# Patient Record
Sex: Female | Born: 1940 | Race: White | Hispanic: No | Marital: Married | Smoking: Former smoker
Health system: Southern US, Community
[De-identification: ages and names within clinical notes are randomized; demographics above are authoritative.]

## PROBLEM LIST (undated history)

## (undated) DIAGNOSIS — I2699 Other pulmonary embolism without acute cor pulmonale: Secondary | ICD-10-CM

## (undated) HISTORY — PX: APPENDECTOMY: SHX54

## (undated) HISTORY — PX: TONSILLECTOMY: SUR1361

## (undated) HISTORY — PX: JOINT REPLACEMENT: SHX530

## (undated) HISTORY — PX: CHOLECYSTECTOMY: SHX55

## (undated) HISTORY — DX: Other pulmonary embolism without acute cor pulmonale: I26.99

---

## 1972-06-17 DIAGNOSIS — I2699 Other pulmonary embolism without acute cor pulmonale: Secondary | ICD-10-CM

## 1972-06-17 HISTORY — DX: Other pulmonary embolism without acute cor pulmonale: I26.99

## 2008-02-01 ENCOUNTER — Ambulatory Visit: Payer: Self-pay | Admitting: General Surgery

## 2009-06-27 ENCOUNTER — Ambulatory Visit: Payer: Self-pay | Admitting: Internal Medicine

## 2009-07-31 ENCOUNTER — Ambulatory Visit: Payer: Self-pay | Admitting: Internal Medicine

## 2009-11-10 ENCOUNTER — Ambulatory Visit: Payer: Self-pay | Admitting: Gastroenterology

## 2010-07-04 ENCOUNTER — Ambulatory Visit: Payer: Self-pay | Admitting: Internal Medicine

## 2011-02-28 ENCOUNTER — Telehealth: Payer: Self-pay | Admitting: Internal Medicine

## 2011-02-28 NOTE — Telephone Encounter (Signed)
PT CALLED TO SCHEDULE HER CPE I SCHEDULE IT FOR  04/12/11 BUT SHE WANTED TO SEE WHEN HER LAST CPX WAS.  SHE SAID SHE SIGNED MEDICAL RELEASE FORM

## 2011-02-28 NOTE — Telephone Encounter (Signed)
Left message for patient to return my call.

## 2011-04-12 ENCOUNTER — Ambulatory Visit (INDEPENDENT_AMBULATORY_CARE_PROVIDER_SITE_OTHER): Payer: Medicare Other | Admitting: Internal Medicine

## 2011-04-12 ENCOUNTER — Encounter: Payer: Self-pay | Admitting: Internal Medicine

## 2011-04-12 DIAGNOSIS — Z0000 Encounter for general adult medical examination without abnormal findings: Secondary | ICD-10-CM

## 2011-04-12 NOTE — Patient Instructions (Signed)
You are doing well. Continue with healthy diet and exercise.  Follow up in 6 months or earlier as needed.

## 2011-04-12 NOTE — Progress Notes (Signed)
Subjective:    Jade Campbell is a 70 y.o. female who presents for Medicare Annual/Subsequent preventive examination. She denies any complaints today. She reports that she has been feeling well. She has been very active exercising regularly by working out at [employer]. She denies any recent fever, chills, fatigue, dysuria, constipation, diarrhea, or other complaints.  Preventive Screening-Counseling & Management  Tobacco History  Smoking status  . Former Smoker  . Quit date: 06/17/1976  Smokeless tobacco  . Never Used      Current Problems (verified) There is no problem list on file for this patient.   Medications Prior to Visit No current outpatient prescriptions on file prior to visit.    Current Medications (verified) No current outpatient prescriptions on file.     Allergies (verified) Sulfa drugs cross reactors   PAST HISTORY 1. Pulmonary Embolus  Family History No family history on file.  Social History History  Substance Use Topics  . Smoking status: Former Smoker    Quit date: 06/17/1976  . Smokeless tobacco: Never Used  . Alcohol Use: Yes     occassionally     Are there smokers in your home (other than you)? Yes, daughter smokes outside  Risk Factors Current exercise habits: works out at [employer] 2-3 times per week for 1.5 hr  Dietary issues discussed: Healthy, low in saturated fat, high fiber   Cardiac risk factors: advanced age (older than 69 for men, 75 for women).  Depression Screen (Note: if answer to either of the following is "Yes", a more complete depression screening is indicated)   Over the past two weeks, have you felt down, depressed or hopeless? No  Over the past two weeks, have you felt little interest or pleasure in doing things? No  Have you lost interest or pleasure in daily life? No  Do you often feel hopeless? No  Do you cry easily over simple problems? No  Activities of Daily Living In your present state of health, do you have  any difficulty performing the following activities?:  Driving? No Managing money?  No Feeding yourself? No Getting from bed to chair? No Climbing a flight of stairs? No Preparing food and eating?: No Bathing or showering? No Getting dressed: No Getting to the toilet? No Using the toilet:No Moving around from place to place: No In the past year have you fallen or had a near fall?:No   Are you sexually active?  No  Do you have more than one partner?  No  Hearing Difficulties: No Do you often ask people to speak up or repeat themselves? No Do you experience ringing or noises in your ears? No Do you have difficulty understanding soft or whispered voices? No   Do you feel that you have a problem with memory? No  Do you often misplace items? No  Do you feel safe at home?  Yes  Cognitive Testing  Alert? Yes  Normal Appearance?Yes  Oriented to person? Yes  Place? Yes   Time? Yes  Recall of three objects?  Yes  Can perform simple calculations? Yes  Displays appropriate judgment?Yes  Can read the correct time from a watch face?Yes   Advanced Directives have been discussed with the patient? Yes  List the Names of Other Physician/Practitioners you currently use: 1.  [facility] - Dr. Fransico Michael    There is no immunization history on file for this patient.  Screening Tests Health Maintenance  Topic Date Due  . Tetanus/tdap  09/15/1960  . Mammogram  09/16/1991  . Colonoscopy  09/16/1991  . Zostavax  09/15/2001  . Pneumococcal Polysaccharide Vaccine Age 9 And Over  09/16/2006  . Influenza Vaccine  03/18/2011    All answers were reviewed with the patient and necessary referrals were made:  Shelia Media, MD   04/12/2011   History reviewed: allergies, current medications, past family history, past medical history, past social history, past surgical history and problem list  Review of Systems A comprehensive review of systems was negative.    Objective:      Body mass index is  29.02 kg/(m^2). BP 127/84  Pulse 54  Temp 98.4 F (36.9 C)  Resp 16  Ht 5\' 10"  (1.778 m)  Wt 202 lb 4 oz (91.74 kg)  BMI 29.02 kg/m2  SpO2 98%  BP 127/84  Pulse 54  Temp 98.4 F (36.9 C)  Resp 16  Ht 5\' 10"  (1.778 m)  Wt 202 lb 4 oz (91.74 kg)  BMI 29.02 kg/m2  SpO2 98%  General Appearance:    Alert, cooperative, no distress, appears stated age  Head:    Normocephalic, without obvious abnormality, atraumatic  Eyes:    PERRL, conjunctiva/corneas clear, EOM's intact, fundi    benign, both eyes  Ears:    Normal TM's and external ear canals, both ears  Nose:   Nares normal, septum midline, mucosa normal, no drainage    or sinus tenderness  Throat:   Lips, mucosa, and tongue normal; teeth and gums normal  Neck:   Supple, symmetrical, trachea midline, no adenopathy;    thyroid:  no enlargement/tenderness/nodules; no carotid   bruit or JVD  Back:     Symmetric, no curvature, ROM normal, no CVA tenderness  Lungs:     Clear to auscultation bilaterally, respirations unlabored  Chest Wall:    No tenderness or deformity   Heart:    Regular rate and rhythm, S1 and S2 normal, no murmur, rub   or gallop  Breast Exam:    No tenderness, masses, or nipple abnormality  Abdomen:     Soft, non-tender, bowel sounds active all four quadrants,    no masses, no organomegaly  Extremities:   Extremities normal, atraumatic, no cyanosis or edema  Pulses:   2+ and symmetric all extremities  Skin:   Skin color, texture, turgor normal, no rashes or lesions  Lymph nodes:   Cervical, supraclavicular, and axillary nodes normal  Neurologic:   CNII-XII intact, normal strength, sensation and reflexes    throughout       Assessment and Plan:     1. General well exam - Pt is up to date on health maintenance including PAP smear and colonoscopy. Mammogram is scheduled. Breast exam was normal today.  Labs including CBC, CMP, lipids were ordered today. Flu shot declined.  Will check old records to see if  patient has received Zostavax and is up to date on bone density testing. Encouraged continued healthy diet and regular exercise.      Diet review for nutrition referral? Not Indicated  Patient Instructions (the written plan) was given to the patient.  Medicare Attestation I have personally reviewed: The patient's medical and social history Their use of alcohol, tobacco or illicit drugs Their current medications and supplements The patient's functional ability including ADLs,fall risks, home safety risks, cognitive, and hearing and visual impairment Diet and physical activities Evidence for depression or mood disorders  The patient's weight, height, BMI, and visual acuity have been recorded in the chart.  I have  made referrals, counseling, and provided education to the patient based on review of the above and I have provided the patient with a written personalized care plan for preventive services.     Shelia Media, MD   04/12/2011

## 2011-04-16 ENCOUNTER — Telehealth: Payer: Self-pay | Admitting: Internal Medicine

## 2011-04-16 NOTE — Telephone Encounter (Signed)
Labs from [facility] including CBC, CMP, and lipids were all normal.

## 2011-04-16 NOTE — Telephone Encounter (Signed)
Patient notified

## 2011-04-23 ENCOUNTER — Telehealth: Payer: Self-pay | Admitting: Internal Medicine

## 2011-04-23 NOTE — Telephone Encounter (Signed)
If her insurance will cover shingles vaccine, fine to call in Rx.

## 2011-04-23 NOTE — Telephone Encounter (Signed)
Jade Campbell [phone] Pt came in today checking to see if we received her records from [facility].  i called spoke to lisa she is sending the entire chart including previous md. Her pneumia shot was 05/23/09  tdap was 12/11/09 no shingles shot was on record at [facility] per lisa.  Pt would like to get shot. She will check with insurance company first

## 2011-04-23 NOTE — Telephone Encounter (Signed)
Patient will call office back if she needs RX for shingles after calling insurance.

## 2011-04-24 ENCOUNTER — Telehealth: Payer: Self-pay | Admitting: Internal Medicine

## 2011-04-24 MED ORDER — ZOSTER VACCINE LIVE 19400 UNT/0.65ML ~~LOC~~ SOLR: 0.6500 mL | Freq: Once | SUBCUTANEOUS | Status: AC

## 2011-04-24 NOTE — Telephone Encounter (Signed)
Pending signature

## 2011-04-24 NOTE — Telephone Encounter (Signed)
Pt would like to get a rx for shingles shot.  Please call her when ready

## 2011-04-25 NOTE — Telephone Encounter (Signed)
Left vm for pt that RX was ready for pick up

## 2011-05-06 ENCOUNTER — Encounter: Payer: Self-pay | Admitting: Internal Medicine

## 2011-05-23 ENCOUNTER — Encounter: Payer: Self-pay | Admitting: Internal Medicine

## 2011-06-07 ENCOUNTER — Telehealth: Payer: Self-pay | Admitting: Internal Medicine

## 2011-06-07 MED ORDER — AMOXICILLIN 500 MG PO CAPS: 2000.0000 mg | ORAL_CAPSULE | Freq: Once | ORAL | Status: AC

## 2011-06-07 NOTE — Telephone Encounter (Signed)
The typical dose is Amoxicillin 2GM 1 hr prior to dental procedure.  We can call this in for her.

## 2011-06-07 NOTE — Telephone Encounter (Signed)
Patient informed, RX sent in  

## 2011-06-07 NOTE — Telephone Encounter (Signed)
Patient is needing an prescription for amoxicillin 500 mg before she has dental work done, so it will not effect her knees she is going to [place] to have dental work done . She is leaving on next Wednesday.. Patient is needing a response today.

## 2011-06-07 NOTE — Telephone Encounter (Signed)
Please advise 

## 2011-06-13 ENCOUNTER — Encounter: Payer: Self-pay | Admitting: Internal Medicine

## 2011-06-24 ENCOUNTER — Ambulatory Visit (INDEPENDENT_AMBULATORY_CARE_PROVIDER_SITE_OTHER): Payer: Medicare Other | Admitting: *Deleted

## 2011-06-24 DIAGNOSIS — Z23 Encounter for immunization: Secondary | ICD-10-CM

## 2011-06-26 ENCOUNTER — Telehealth: Payer: Self-pay | Admitting: Internal Medicine

## 2011-06-26 DIAGNOSIS — Z1239 Encounter for other screening for malignant neoplasm of breast: Secondary | ICD-10-CM

## 2011-06-26 NOTE — Telephone Encounter (Signed)
Pt came in to get an appointment with [facility] for her mammorgram.   i told her she could make her own appointment with them.  Do we need to send them an order

## 2011-06-27 LAB — HM MAMMOGRAPHY

## 2011-06-27 NOTE — Telephone Encounter (Signed)
Can we please get an order for a mammogram patient is going to make her own appointment. Thanks

## 2011-06-27 NOTE — Telephone Encounter (Signed)
Order added and signed

## 2011-06-27 NOTE — Telephone Encounter (Signed)
Printed order, Dr. Dan Humphreys signed and I faxed to [facility].

## 2011-07-12 ENCOUNTER — Ambulatory Visit: Payer: Self-pay | Admitting: Internal Medicine

## 2011-07-22 ENCOUNTER — Encounter: Payer: Self-pay | Admitting: Internal Medicine

## 2011-07-25 ENCOUNTER — Encounter: Payer: Self-pay | Admitting: Internal Medicine

## 2011-10-03 ENCOUNTER — Encounter: Payer: Self-pay | Admitting: Internal Medicine

## 2011-10-31 ENCOUNTER — Encounter: Payer: Self-pay | Admitting: Internal Medicine

## 2011-10-31 ENCOUNTER — Ambulatory Visit (INDEPENDENT_AMBULATORY_CARE_PROVIDER_SITE_OTHER): Payer: Medicare Other | Admitting: Internal Medicine

## 2011-10-31 VITALS — BP 118/63 | HR 54 | Temp 97.5°F | Resp 16 | Ht 69.75 in | Wt 189.5 lb

## 2011-10-31 DIAGNOSIS — E785 Hyperlipidemia, unspecified: Secondary | ICD-10-CM | POA: Insufficient documentation

## 2011-10-31 LAB — LIPID PANEL
Cholesterol: 178 mg/dL (ref 0–200)
HDL: 53 mg/dL (ref >39.00)
Triglycerides: 70 mg/dL (ref 0.0–149.0)
VLDL: 14 mg/dL (ref 0.0–40.0)

## 2011-10-31 LAB — COMPREHENSIVE METABOLIC PANEL
ALT: 12 U/L (ref 0–35)
BUN: 13 mg/dL (ref 6–23)
CO2: 28 mEq/L (ref 19–32)
Calcium: 8.9 mg/dL (ref 8.4–10.5)
Chloride: 103 mEq/L (ref 96–112)
Creatinine, Ser: 0.7 mg/dL (ref 0.4–1.2)
GFR: 92.45 mL/min (ref >60.00)
Glucose, Bld: 72 mg/dL (ref 70–99)
Total Bilirubin: 0.4 mg/dL (ref 0.3–1.2)

## 2011-10-31 NOTE — Progress Notes (Signed)
  Subjective:    Patient ID: Jade Campbell, female    DOB: [birth year], 71 y.o.   MRN: [id number]  HPI 71 year old female presents for followup. She reports she is generally feeling well. She brings records today so that we can update her health maintenance in her medical record. She is currently up to date on everything except for DEXA scanning. She was noted to have slightly elevated cholesterol in the past. She has never been on cholesterol medication. She follows a healthy diet and gets regular physical activity.  Outpatient Encounter Prescriptions as of 10/31/2011  Medication Sig Dispense Refill  . Calcium Carbonate-Vitamin D (CALCIUM-VITAMIN D3 PO) Take by mouth daily.      . Multiple Vitamin (MULTIVITAMIN) tablet Take 1 tablet by mouth daily.        Review of Systems  Constitutional: Negative for fever, chills, appetite change, fatigue and unexpected weight change.  HENT: Negative for ear pain, congestion, sore throat, trouble swallowing, neck pain, voice change and sinus pressure.   Eyes: Negative for visual disturbance.  Respiratory: Negative for cough, shortness of breath, wheezing and stridor.   Cardiovascular: Negative for chest pain, palpitations and leg swelling.  Gastrointestinal: Negative for nausea, vomiting, abdominal pain, diarrhea, constipation, blood in stool, abdominal distention and anal bleeding.  Genitourinary: Negative for dysuria and flank pain.  Musculoskeletal: Negative for myalgias, arthralgias and gait problem.  Skin: Negative for color change and rash.  Neurological: Negative for dizziness and headaches.  Hematological: Negative for adenopathy. Does not bruise/bleed easily.  Psychiatric/Behavioral: Negative for suicidal ideas, sleep disturbance and dysphoric mood. The patient is not nervous/anxious.    BP 118/63  Pulse 54  Temp(Src) 97.5 F (36.4 C) (Oral)  Resp 16  Ht 5' 9.75" (1.772 m)  Wt 189 lb 8 oz (85.957 kg)  BMI 27.39 kg/m2  SpO2 100%       Objective:   Physical Exam  Constitutional: She is oriented to person, place, and time. She appears well-developed and well-nourished. No distress.  HENT:  Head: Normocephalic and atraumatic.  Right Ear: External ear normal.  Left Ear: External ear normal.  Nose: Nose normal.  Mouth/Throat: Oropharynx is clear and moist. No oropharyngeal exudate.  Eyes: Conjunctivae are normal. Pupils are equal, round, and reactive to light. Right eye exhibits no discharge. Left eye exhibits no discharge. No scleral icterus.  Neck: Normal range of motion. Neck supple. No tracheal deviation present. No thyromegaly present.  Cardiovascular: Normal rate, regular rhythm, normal heart sounds and intact distal pulses.  Exam reveals no gallop and no friction rub.   No murmur heard. Pulmonary/Chest: Effort normal and breath sounds normal. No respiratory distress. She has no wheezes. She has no rales. She exhibits no tenderness.  Abdominal: Soft. Bowel sounds are normal. She exhibits no distension and no mass. There is no tenderness. There is no guarding.  Musculoskeletal: Normal range of motion. She exhibits no edema and no tenderness.  Lymphadenopathy:    She has no cervical adenopathy.  Neurological: She is alert and oriented to person, place, and time. No cranial nerve deficit. She exhibits normal muscle tone. Coordination normal.  Skin: Skin is warm and dry. No rash noted. She is not diaphoretic. No erythema. No pallor.  Psychiatric: She has a normal mood and affect. Her behavior is normal. Judgment and thought content normal.          Assessment & Plan:

## 2011-10-31 NOTE — Assessment & Plan Note (Signed)
Lipids slightly elevated in past. Will recheck lipids and LFTs today. Goal LDL <100.

## 2011-11-21 ENCOUNTER — Encounter: Payer: Self-pay | Admitting: Internal Medicine

## 2012-05-06 ENCOUNTER — Ambulatory Visit (INDEPENDENT_AMBULATORY_CARE_PROVIDER_SITE_OTHER): Payer: Medicare Other | Admitting: Internal Medicine

## 2012-05-06 ENCOUNTER — Encounter: Payer: Self-pay | Admitting: Internal Medicine

## 2012-05-06 VITALS — BP 142/78 | HR 68 | Temp 97.7°F | Ht 69.0 in | Wt 191.0 lb

## 2012-05-06 DIAGNOSIS — Z0000 Encounter for general adult medical examination without abnormal findings: Secondary | ICD-10-CM

## 2012-05-06 DIAGNOSIS — E559 Vitamin D deficiency, unspecified: Secondary | ICD-10-CM | POA: Insufficient documentation

## 2012-05-06 DIAGNOSIS — D649 Anemia, unspecified: Secondary | ICD-10-CM

## 2012-05-06 DIAGNOSIS — Z23 Encounter for immunization: Secondary | ICD-10-CM

## 2012-05-06 DIAGNOSIS — E785 Hyperlipidemia, unspecified: Secondary | ICD-10-CM

## 2012-05-06 LAB — LIPID PANEL
HDL: 52.2 mg/dL (ref >39.00)
Total CHOL/HDL Ratio: 4
Triglycerides: 57 mg/dL (ref 0.0–149.0)
VLDL: 11.4 mg/dL (ref 0.0–40.0)

## 2012-05-06 LAB — CBC
Hemoglobin: 13.1 g/dL (ref 12.0–15.0)
MCHC: 32.4 g/dL (ref 30.0–36.0)
MCV: 84.4 fl (ref 78.0–100.0)
Platelets: 253 10*3/uL (ref 150.0–400.0)
RDW: 13.8 % (ref 11.5–14.6)

## 2012-05-06 LAB — COMPREHENSIVE METABOLIC PANEL
ALT: 16 U/L (ref 0–35)
AST: 20 U/L (ref 0–37)
BUN: 12 mg/dL (ref 6–23)
Calcium: 9.1 mg/dL (ref 8.4–10.5)
Chloride: 102 mEq/L (ref 96–112)
Creatinine, Ser: 0.6 mg/dL (ref 0.4–1.2)
GFR: 100.96 mL/min (ref >60.00)
Total Bilirubin: 0.5 mg/dL (ref 0.3–1.2)

## 2012-05-06 NOTE — Progress Notes (Signed)
Subjective:    Patient ID: Jade Campbell, female    DOB: July 23, 1940, 71 y.o.   MRN: [id number]  HPI The patient is here for annual Medicare wellness examination and management of other chronic and acute problems.   The risk factors are reflected in the social history.  The roster of all physicians providing medical care to patient - is listed in the Snapshot section of the chart.  Activities of daily living:  The patient is 100% independent in all ADLs: dressing, toileting, feeding as well as independent mobility  Home safety : The patient has smoke detectors in the home. They wear seatbelts.  There are no firearms at home. There is no violence in the home.   There is no risks for hepatitis, STDs or HIV. There is no history of blood transfusion. They have no travel history to infectious disease endemic areas of the world.  The patient has seen their dentist in the last six month. ([place] - Dr. Welton Flakes) They have seen their eye doctor in the last year. [facility]) No current issues with hearing. They have deferred audiologic testing in the last year.   They do not  have excessive sun exposure. Discussed the need for sun protection: hats, long sleeves and use of sunscreen if there is significant sun exposure. Dermatologist - none)  Diet: the importance of a healthy diet is discussed. They do have a healthy diet.  The benefits of regular aerobic exercise were discussed. She walks or bikes several times per week.  Depression screen: there are no signs or vegative symptoms of depression- irritability, change in appetite, anhedonia, sadness/tearfullness.  Cognitive assessment: the patient manages all their financial and personal affairs and is actively engaged. They could relate day,date,year and events.  HCPOA - spouse, Living Will in place. Son is executor of will.  The following portions of the patient's history were reviewed and updated as appropriate: allergies, current medications,  past family history, past medical history,  past surgical history, past social history  and problem list.  Visual acuity was not assessed per patient preference since she has regular follow up with her ophthalmologist. Hearing and body mass index were assessed and reviewed.   During the course of the visit the patient was educated and counseled about appropriate screening and preventive services including : fall prevention , diabetes screening, nutrition counseling, colorectal cancer screening, and recommended immunizations.     Review of Systems  Constitutional: Negative for fever, chills, appetite change, fatigue and unexpected weight change.  HENT: Negative for ear pain, congestion, sore throat, trouble swallowing, neck pain, voice change and sinus pressure.   Eyes: Negative for visual disturbance.  Respiratory: Negative for cough, shortness of breath, wheezing and stridor.   Cardiovascular: Negative for chest pain, palpitations and leg swelling.  Gastrointestinal: Negative for nausea, vomiting, abdominal pain, diarrhea, constipation, blood in stool, abdominal distention and anal bleeding.  Genitourinary: Negative for dysuria and flank pain.  Musculoskeletal: Negative for myalgias, arthralgias and gait problem.  Skin: Negative for color change and rash.  Neurological: Negative for dizziness and headaches.  Hematological: Negative for adenopathy. Does not bruise/bleed easily.  Psychiatric/Behavioral: Negative for suicidal ideas, sleep disturbance and dysphoric mood. The patient is not nervous/anxious.        Objective:   Physical Exam  Constitutional: She is oriented to person, place, and time. She appears well-developed and well-nourished. No distress.  HENT:  Head: Normocephalic and atraumatic.  Right Ear: External ear normal.  Left Ear: External ear  normal.  Nose: Nose normal.  Mouth/Throat: Oropharynx is clear and moist. No oropharyngeal exudate.  Eyes: Conjunctivae normal are  normal. Pupils are equal, round, and reactive to light. Right eye exhibits no discharge. Left eye exhibits no discharge. No scleral icterus.  Neck: Normal range of motion. Neck supple. No tracheal deviation present. No thyromegaly present.  Cardiovascular: Normal rate, regular rhythm, normal heart sounds and intact distal pulses.  Exam reveals no gallop and no friction rub.   No murmur heard. Pulmonary/Chest: Effort normal and breath sounds normal. No respiratory distress. She has no wheezes. She has no rales. She exhibits no tenderness. Right breast exhibits no inverted nipple, no mass, no nipple discharge, no skin change and no tenderness. Left breast exhibits no inverted nipple, no mass, no nipple discharge, no skin change and no tenderness. Breasts are symmetrical.  Abdominal: Soft. Bowel sounds are normal. She exhibits no distension and no mass. There is no tenderness. There is no rebound and no guarding.  Musculoskeletal: Normal range of motion. She exhibits no edema and no tenderness.  Lymphadenopathy:    She has no cervical adenopathy.  Neurological: She is alert and oriented to person, place, and time. No cranial nerve deficit. She exhibits normal muscle tone. Coordination normal.  Skin: Skin is warm and dry. No rash noted. She is not diaphoretic. No erythema. No pallor.  Psychiatric: She has a normal mood and affect. Her behavior is normal. Judgment and thought content normal.          Assessment & Plan:

## 2012-05-06 NOTE — Assessment & Plan Note (Signed)
General medical exam including breast exam normal today. Pap deferred because of age and all previous paps normal. Health maintenance is up to date. Vaccine given today. Appropriate screening performed. Encouraged continued efforts at healthy diet and regular exercise. Followup in one year or sooner if needed.

## 2012-05-07 LAB — VITAMIN D 25 HYDROXY (VIT D DEFICIENCY, FRACTURES): Vit D, 25-Hydroxy: 41 ng/mL (ref 30–89)

## 2012-08-11 ENCOUNTER — Ambulatory Visit: Payer: Self-pay | Admitting: Internal Medicine

## 2012-08-28 ENCOUNTER — Encounter: Payer: Self-pay | Admitting: Internal Medicine

## 2012-09-01 ENCOUNTER — Encounter: Payer: Self-pay | Admitting: Internal Medicine

## 2012-12-22 ENCOUNTER — Encounter: Payer: Self-pay | Admitting: Internal Medicine

## 2012-12-22 ENCOUNTER — Ambulatory Visit (INDEPENDENT_AMBULATORY_CARE_PROVIDER_SITE_OTHER): Payer: Medicare Other | Admitting: Internal Medicine

## 2012-12-22 VITALS — BP 118/70 | HR 56 | Temp 98.2°F | Wt 197.0 lb

## 2012-12-22 DIAGNOSIS — E663 Overweight: Secondary | ICD-10-CM | POA: Insufficient documentation

## 2012-12-22 DIAGNOSIS — Z6825 Body mass index (BMI) 25.0-25.9, adult: Secondary | ICD-10-CM

## 2012-12-22 DIAGNOSIS — Z7189 Other specified counseling: Secondary | ICD-10-CM

## 2012-12-22 DIAGNOSIS — Z7184 Encounter for health counseling related to travel: Secondary | ICD-10-CM | POA: Insufficient documentation

## 2012-12-22 DIAGNOSIS — H6120 Impacted cerumen, unspecified ear: Secondary | ICD-10-CM | POA: Insufficient documentation

## 2012-12-22 DIAGNOSIS — H6123 Impacted cerumen, bilateral: Secondary | ICD-10-CM

## 2012-12-22 MED ORDER — CIPROFLOXACIN HCL 500 MG PO TABS: 500.0000 mg | ORAL_TABLET | Freq: Two times a day (BID) | ORAL | Status: DC

## 2012-12-22 NOTE — Progress Notes (Signed)
  Subjective:    Patient ID: Jade Campbell, female    DOB: 1940/08/10, 72 y.o.   MRN: [id number]  HPI 72 year old female presents for acute visit complaining of left ear pain and pressure. This is been ongoing for several months. She denies any nasal congestion, fever, chills, change in hearing. She has had cerumen impaction in the past. She is otherwise feeling well. She is planning to travel to [place] later this summer.  Outpatient Encounter Prescriptions as of 12/22/2012  Medication Sig Dispense Refill  . Calcium Carbonate-Vitamin D (CALCIUM-VITAMIN D3 PO) Take by mouth daily.      . Multiple Vitamin (MULTIVITAMIN) tablet Take 1 tablet by mouth daily.       No facility-administered encounter medications on file as of 12/22/2012.   BP 118/70  Pulse 56  Temp(Src) 98.2 F (36.8 C) (Oral)  Wt 197 lb (89.359 kg)  BMI 29.08 kg/m2  SpO2 98%  Review of Systems  Constitutional: Negative for fever, chills, appetite change, fatigue and unexpected weight change.  HENT: Positive for ear pain. Negative for hearing loss, congestion, sore throat, trouble swallowing, neck pain, voice change, sinus pressure and tinnitus.   Eyes: Negative for visual disturbance.  Respiratory: Negative for cough, shortness of breath, wheezing and stridor.   Cardiovascular: Negative for chest pain, palpitations and leg swelling.  Gastrointestinal: Negative for nausea, vomiting, abdominal pain, diarrhea, constipation, blood in stool, abdominal distention and anal bleeding.  Genitourinary: Negative for dysuria and flank pain.  Musculoskeletal: Negative for myalgias, arthralgias and gait problem.  Skin: Negative for color change and rash.  Neurological: Negative for dizziness and headaches.  Hematological: Negative for adenopathy. Does not bruise/bleed easily.  Psychiatric/Behavioral: Negative for suicidal ideas, sleep disturbance and dysphoric mood. The patient is not nervous/anxious.        Objective:   Physical Exam   Constitutional: She is oriented to person, place, and time. She appears well-developed and well-nourished. No distress.  HENT:  Head: Normocephalic and atraumatic.  Right Ear: External ear normal.  Left Ear: Tympanic membrane, external ear and ear canal normal.  Nose: Nose normal.  Mouth/Throat: Oropharynx is clear and moist. No oropharyngeal exudate.  Bilateral ear canals initially obstructed with cerumen, removed on left with warm water lavage revealing normal canal and TM. Right ear canal obstructed.  Eyes: Conjunctivae are normal. Pupils are equal, round, and reactive to light. Right eye exhibits no discharge. Left eye exhibits no discharge. No scleral icterus.  Neck: Normal range of motion. Neck supple. No tracheal deviation present. No thyromegaly present.  Cardiovascular: Normal rate, regular rhythm, normal heart sounds and intact distal pulses.  Exam reveals no gallop and no friction rub.   No murmur heard. Pulmonary/Chest: Effort normal and breath sounds normal. No accessory muscle usage. Not tachypneic. No respiratory distress. She has no decreased breath sounds. She has no wheezes. She has no rhonchi. She has no rales. She exhibits no tenderness.  Musculoskeletal: Normal range of motion. She exhibits no edema and no tenderness.  Lymphadenopathy:    She has no cervical adenopathy.  Neurological: She is alert and oriented to person, place, and time. No cranial nerve deficit. She exhibits normal muscle tone. Coordination normal.  Skin: Skin is warm and dry. No rash noted. She is not diaphoretic. No erythema. No pallor.  Psychiatric: She has a normal mood and affect. Her behavior is normal. Judgment and thought content normal.          Assessment & Plan:

## 2012-12-22 NOTE — Assessment & Plan Note (Signed)
Pt is travelling to [place]. Recommended that she take Cipro with her for use in case of travelers diarrhea. She will take 500mg  po bid if she develops diarrhea. She will avoid use of water, and will drink bottled water only. She is UTD on immunizations.

## 2012-12-22 NOTE — Assessment & Plan Note (Signed)
Bilateral cerumen impaction, resolved on the left after warm water lavage. Right ear canal continues to have cerumen obstruction. Patient will continue to lavage at home. She will either call or stop by for recheck in 2 days. If no improvement, will set up ENT evaluation.

## 2012-12-24 ENCOUNTER — Encounter: Payer: Self-pay | Admitting: Internal Medicine

## 2013-02-03 ENCOUNTER — Ambulatory Visit: Payer: Self-pay | Admitting: Ophthalmology

## 2013-03-24 ENCOUNTER — Ambulatory Visit: Payer: Self-pay | Admitting: Ophthalmology

## 2013-04-22 ENCOUNTER — Other Ambulatory Visit: Payer: Self-pay

## 2013-05-07 ENCOUNTER — Ambulatory Visit (INDEPENDENT_AMBULATORY_CARE_PROVIDER_SITE_OTHER): Payer: Medicare Other | Admitting: Internal Medicine

## 2013-05-07 ENCOUNTER — Encounter: Payer: Self-pay | Admitting: Internal Medicine

## 2013-05-07 VITALS — BP 150/76 | HR 51 | Temp 98.0°F | Ht 68.75 in | Wt 198.0 lb

## 2013-05-07 DIAGNOSIS — E559 Vitamin D deficiency, unspecified: Secondary | ICD-10-CM

## 2013-05-07 DIAGNOSIS — Z23 Encounter for immunization: Secondary | ICD-10-CM

## 2013-05-07 DIAGNOSIS — Z136 Encounter for screening for cardiovascular disorders: Secondary | ICD-10-CM

## 2013-05-07 DIAGNOSIS — E785 Hyperlipidemia, unspecified: Secondary | ICD-10-CM

## 2013-05-07 DIAGNOSIS — Z0000 Encounter for general adult medical examination without abnormal findings: Secondary | ICD-10-CM

## 2013-05-07 DIAGNOSIS — D649 Anemia, unspecified: Secondary | ICD-10-CM

## 2013-05-07 LAB — COMPREHENSIVE METABOLIC PANEL
ALT: 15 U/L (ref 0–35)
AST: 17 U/L (ref 0–37)
Albumin: 3.7 g/dL (ref 3.5–5.2)
Alkaline Phosphatase: 60 U/L (ref 39–117)
Creatinine, Ser: 0.6 mg/dL (ref 0.4–1.2)
Potassium: 4.8 mEq/L (ref 3.5–5.1)
Sodium: 136 mEq/L (ref 135–145)
Total Bilirubin: 0.5 mg/dL (ref 0.3–1.2)
Total Protein: 6.6 g/dL (ref 6.0–8.3)

## 2013-05-07 LAB — CBC WITH DIFFERENTIAL/PLATELET
Basophils Absolute: 0 10*3/uL (ref 0.0–0.1)
Basophils Relative: 0.5 % (ref 0.0–3.0)
Eosinophils Relative: 1.5 % (ref 0.0–5.0)
Hemoglobin: 12.8 g/dL (ref 12.0–15.0)
Lymphocytes Relative: 18.5 % (ref 12.0–46.0)
Monocytes Relative: 7 % (ref 3.0–12.0)
Neutro Abs: 4.2 10*3/uL (ref 1.4–7.7)
Neutrophils Relative %: 72.5 % (ref 43.0–77.0)
RBC: 4.61 Mil/uL (ref 3.87–5.11)

## 2013-05-07 LAB — LIPID PANEL
LDL Cholesterol: 134 mg/dL — ABNORMAL HIGH (ref 0–99)
Total CHOL/HDL Ratio: 4

## 2013-05-07 NOTE — Progress Notes (Signed)
Pre-visit discussion using our clinic review tool. No additional management support is needed unless otherwise documented below in the visit note.  

## 2013-05-07 NOTE — Assessment & Plan Note (Signed)
General medical exam including breast exam normal today. Pap deferred because of age and all previous paps normal. Health maintenance is up to date. Flu vaccine given today. Will check labs including CBC, CMP, lipids, Vit D. Appropriate screening performed. Encouraged continued efforts at healthy diet and regular exercise. Followup in one year or sooner if needed.

## 2013-05-07 NOTE — Progress Notes (Signed)
Subjective:    Patient ID: Jade Campbell, female    DOB: 05-Oct-1940, 72 y.o.   MRN: [id number]  HPI  The patient is here for annual Medicare wellness examination and management of other chronic and acute problems.   The risk factors are reflected in the social history.  The roster of all physicians providing medical care to patient - is listed in the Snapshot section of the chart.  Activities of daily living:  The patient is 100% independent in all ADLs: dressing, toileting, feeding as well as independent mobility  Home safety : The patient has smoke detectors in the home. They wear seatbelts.  There are no firearms at home. There is no violence in the home.   There is no risks for hepatitis, STDs or HIV. There is no history of blood transfusion. They have no travel history to infectious disease endemic areas of the world.  The patient has seen their dentist in the last six month. ([place] - Dr. Welton Flakes) They have seen their eye doctor in the last year. S/p cataract surgery bilateral. [facility]) No current issues with hearing. They have deferred audiologic testing in the last year.   They do not  have excessive sun exposure. Discussed the need for sun protection: hats, long sleeves and use of sunscreen if there is significant sun exposure. (Dermatologist - none)  Diet: the importance of a healthy diet is discussed. They do have a healthy diet.  The benefits of regular aerobic exercise were discussed. She walks or bikes several times per week.  Depression screen: there are no signs or vegative symptoms of depression- irritability, change in appetite, anhedonia, sadness/tearfullness.  Cognitive assessment: the patient manages all their financial and personal affairs and is actively engaged. They could relate day,date,year and events.  HCPOA - spouse, Living Will in place. Son is executor of will.  The following portions of the patient's history were reviewed and updated as appropriate:  allergies, current medications, past family history, past medical history,  past surgical history, past social history  and problem list.  Visual acuity was not assessed per patient preference since she has regular follow up with her ophthalmologist. Hearing and body mass index were assessed and reviewed.   During the course of the visit the patient was educated and counseled about appropriate screening and preventive services including : fall prevention , diabetes screening, nutrition counseling, colorectal cancer screening, and recommended immunizations.     Review of Systems  Constitutional: Negative for fever, chills, appetite change, fatigue and unexpected weight change.  HENT: Negative for congestion, ear pain, sinus pressure, sore throat, trouble swallowing and voice change.   Eyes: Negative for visual disturbance.  Respiratory: Negative for cough, shortness of breath, wheezing and stridor.   Cardiovascular: Negative for chest pain, palpitations and leg swelling.  Gastrointestinal: Negative for nausea, vomiting, abdominal pain, diarrhea, constipation, blood in stool, abdominal distention and anal bleeding.  Genitourinary: Negative for dysuria and flank pain.  Musculoskeletal: Negative for arthralgias, gait problem, myalgias and neck pain.  Skin: Negative for color change and rash.  Neurological: Negative for dizziness and headaches.  Hematological: Negative for adenopathy. Does not bruise/bleed easily.  Psychiatric/Behavioral: Negative for suicidal ideas, sleep disturbance and dysphoric mood. The patient is not nervous/anxious.    BP 150/76  Pulse 51  Temp(Src) 98 F (36.7 C) (Oral)  Ht 5' 8.75" (1.746 m)  Wt 198 lb (89.812 kg)  BMI 29.46 kg/m2  SpO2 98%     Objective:   Physical  Exam  Constitutional: She is oriented to person, place, and time. She appears well-developed and well-nourished. No distress.  HENT:  Head: Normocephalic and atraumatic.  Right Ear: External ear  normal.  Left Ear: External ear normal.  Nose: Nose normal.  Mouth/Throat: Oropharynx is clear and moist. No oropharyngeal exudate.  Eyes: Conjunctivae are normal. Pupils are equal, round, and reactive to light. Right eye exhibits no discharge. Left eye exhibits no discharge. No scleral icterus.  Neck: Normal range of motion. Neck supple. No tracheal deviation present. No thyromegaly present.  Cardiovascular: Normal rate, regular rhythm, normal heart sounds and intact distal pulses.  Exam reveals no gallop and no friction rub.   No murmur heard. Pulmonary/Chest: Effort normal and breath sounds normal. No respiratory distress. She has no wheezes. She has no rales. She exhibits no tenderness. Right breast exhibits no inverted nipple, no mass, no nipple discharge, no skin change and no tenderness. Left breast exhibits no inverted nipple, no mass, no nipple discharge, no skin change and no tenderness. Breasts are symmetrical.  Abdominal: Soft. Bowel sounds are normal. She exhibits no distension and no mass. There is no tenderness. There is no rebound and no guarding.  Musculoskeletal: Normal range of motion. She exhibits no edema and no tenderness.  Lymphadenopathy:    She has no cervical adenopathy.  Neurological: She is alert and oriented to person, place, and time. No cranial nerve deficit. She exhibits normal muscle tone. Coordination normal.  Skin: Skin is warm and dry. No rash noted. She is not diaphoretic. No erythema. No pallor.  Psychiatric: She has a normal mood and affect. Her behavior is normal. Judgment and thought content normal.          Assessment & Plan:

## 2013-06-02 ENCOUNTER — Encounter: Payer: Self-pay | Admitting: Emergency Medicine

## 2013-06-24 ENCOUNTER — Telehealth: Payer: Self-pay | Admitting: Internal Medicine

## 2013-06-24 NOTE — Telephone Encounter (Signed)
Needs to be seen for swab for flu. Tomorrow morning 7am?

## 2013-06-24 NOTE — Telephone Encounter (Signed)
The patient's son was diagnosed with the flu and she has a cough ,headache and her throat is dry. She is wanting Tamiflu called into the pharmacy.

## 2013-06-24 NOTE — Telephone Encounter (Signed)
Patient informed and offered her an appointment to be seen. Patient declined and stated she will just take her chances.

## 2013-06-24 NOTE — Telephone Encounter (Signed)
Fwd to Dr. Walker, please advise 

## 2013-08-30 ENCOUNTER — Ambulatory Visit: Payer: Self-pay | Admitting: Internal Medicine

## 2013-08-30 IMAGING — MG MM DIGITAL SCREENING BILAT W/ CAD
1 series · 4 of 4 positions shown · non-contrast
Comparison: Previous exam(s).

CLINICAL DATA: Screening.

EXAM:
DIGITAL SCREENING BILATERAL MAMMOGRAM WITH CAD

[R CC · right · 4 of 4 slices shown]
[im 1/4]
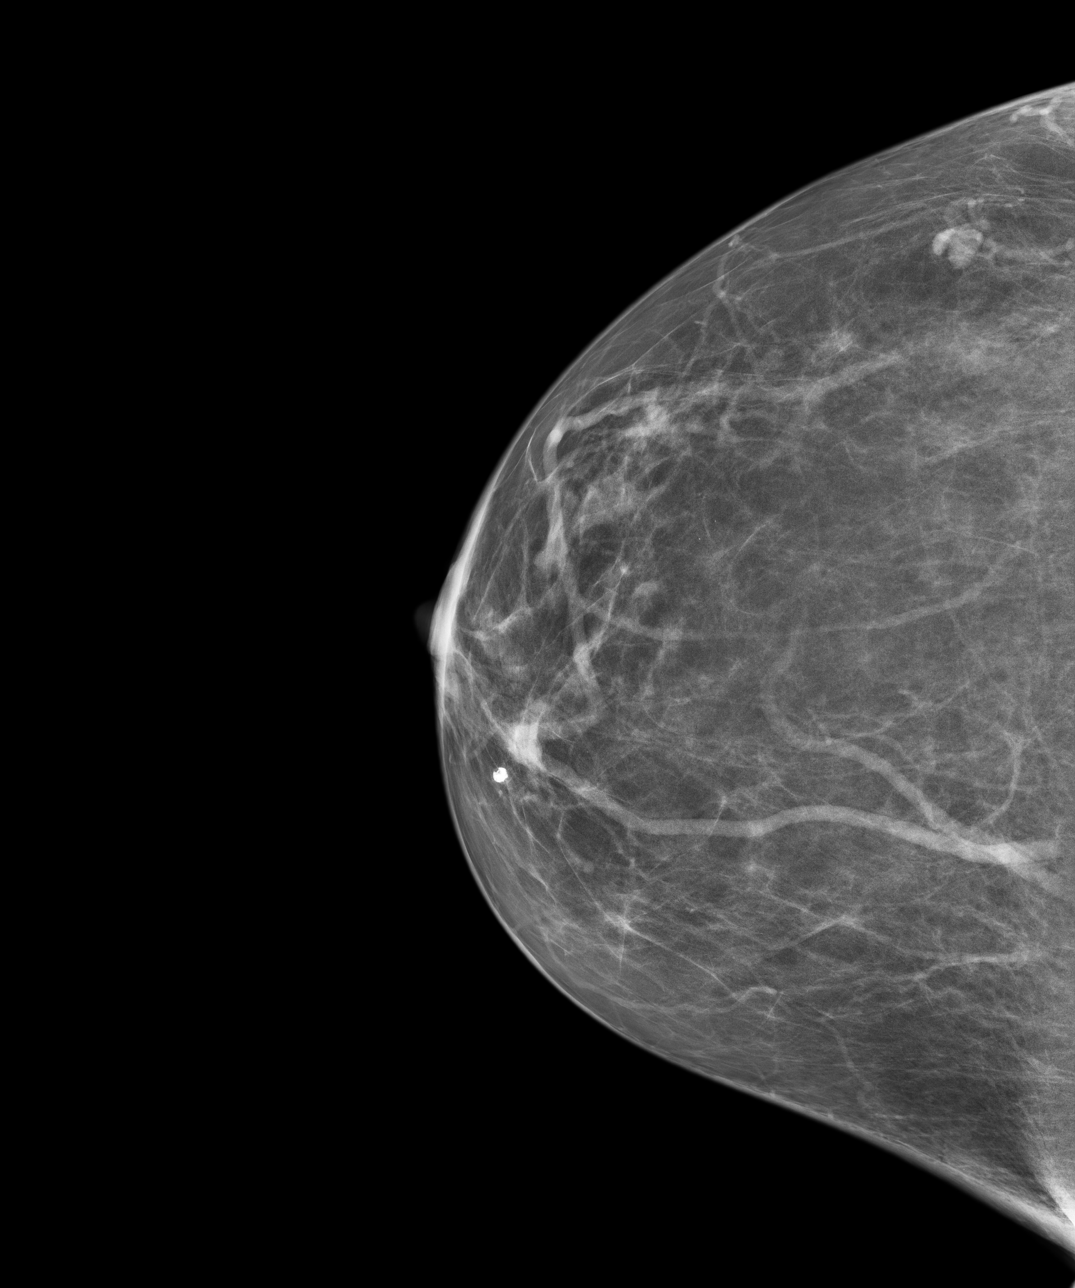
[im 2/4]
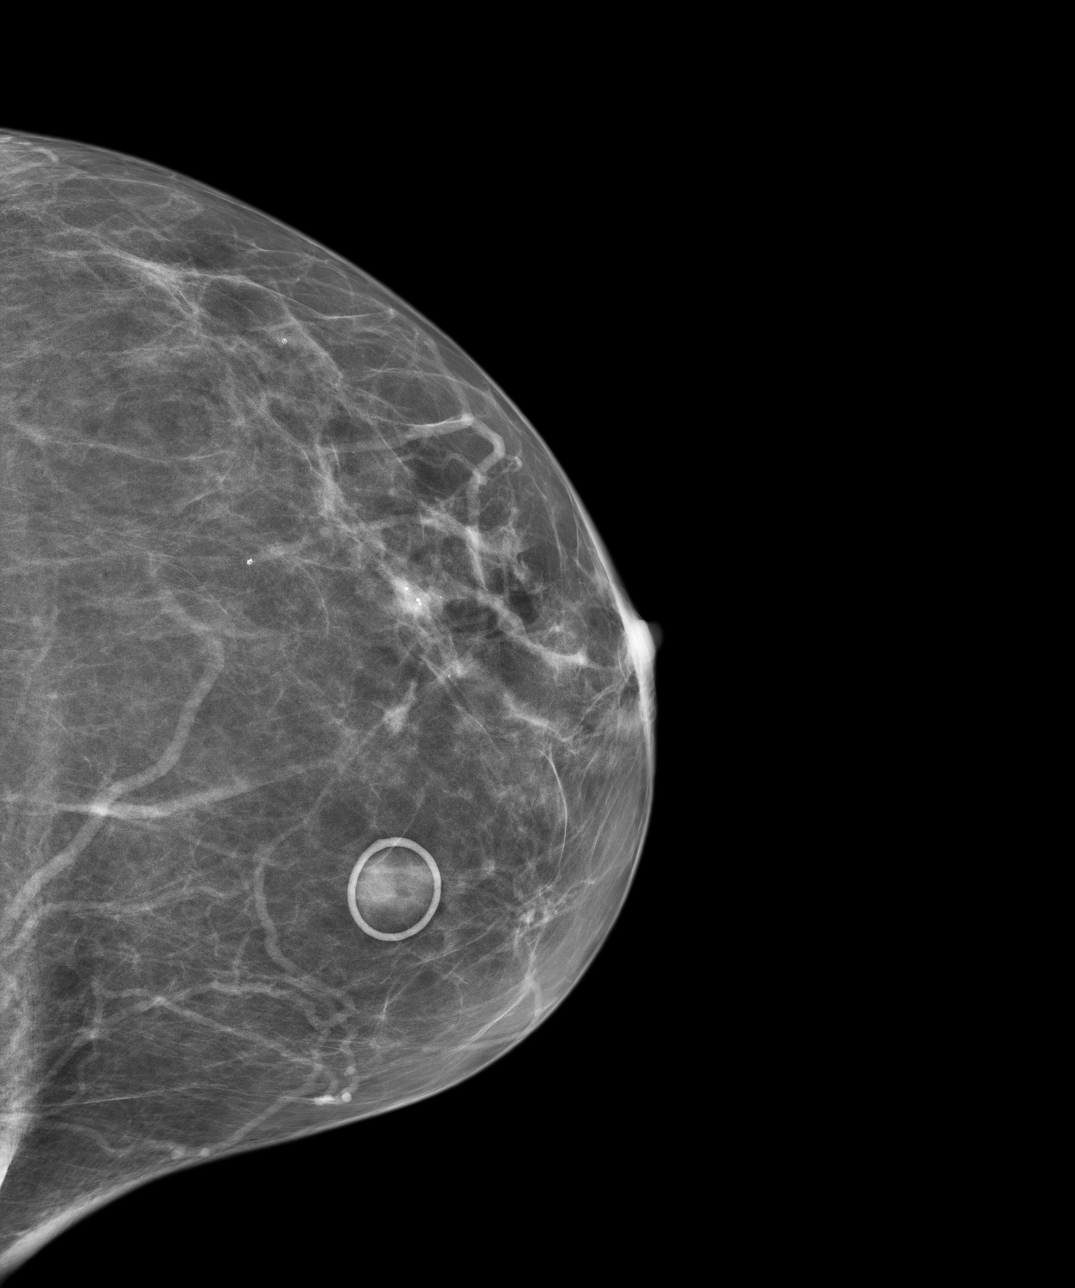
[im 3/4]
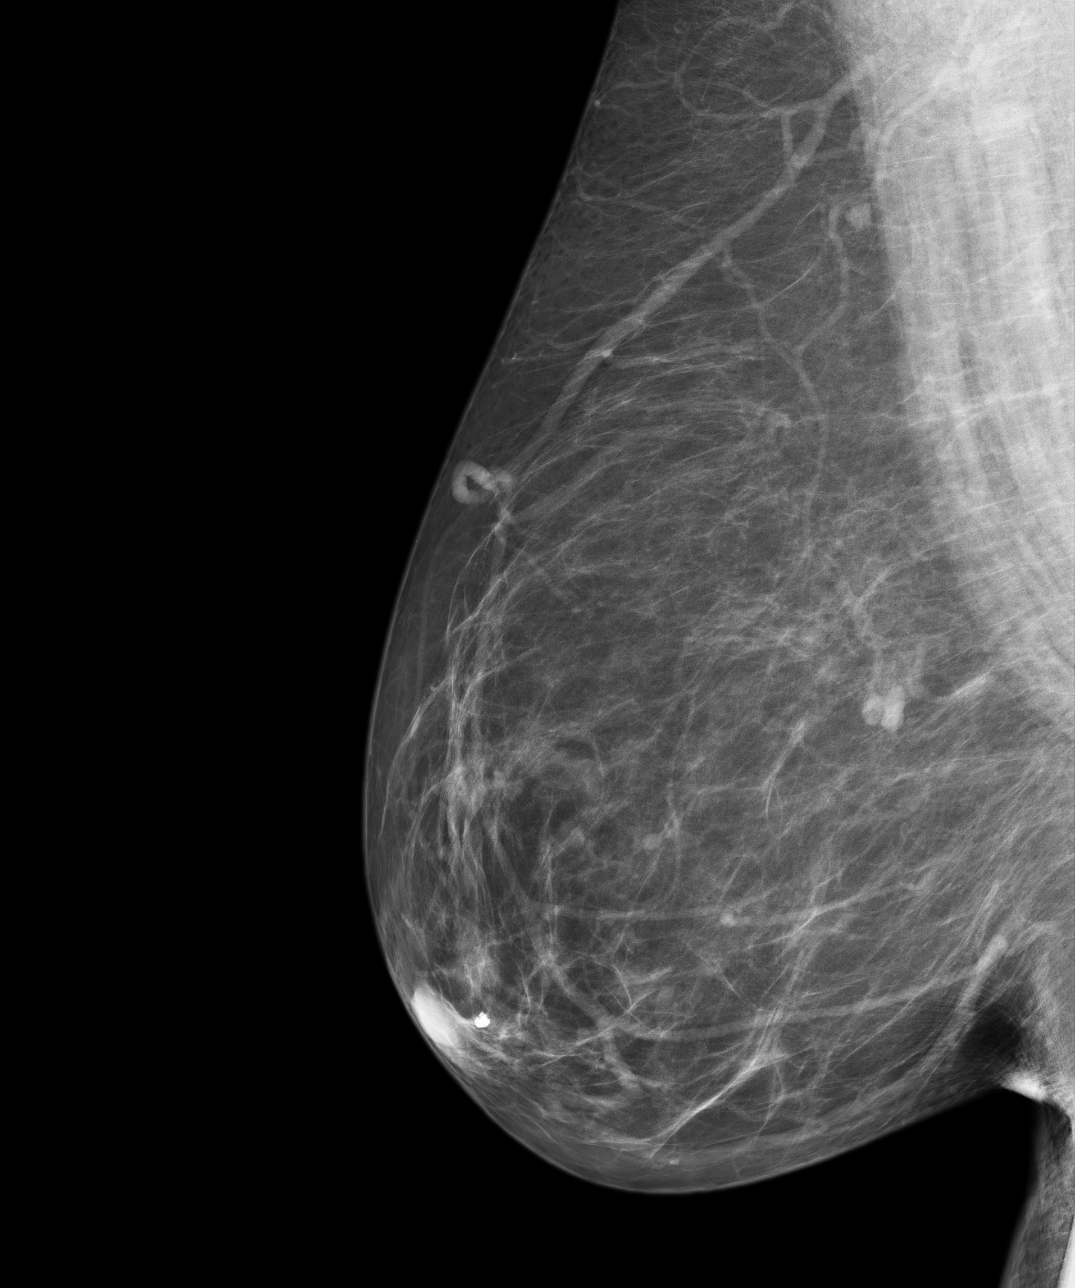
[im 4/4]
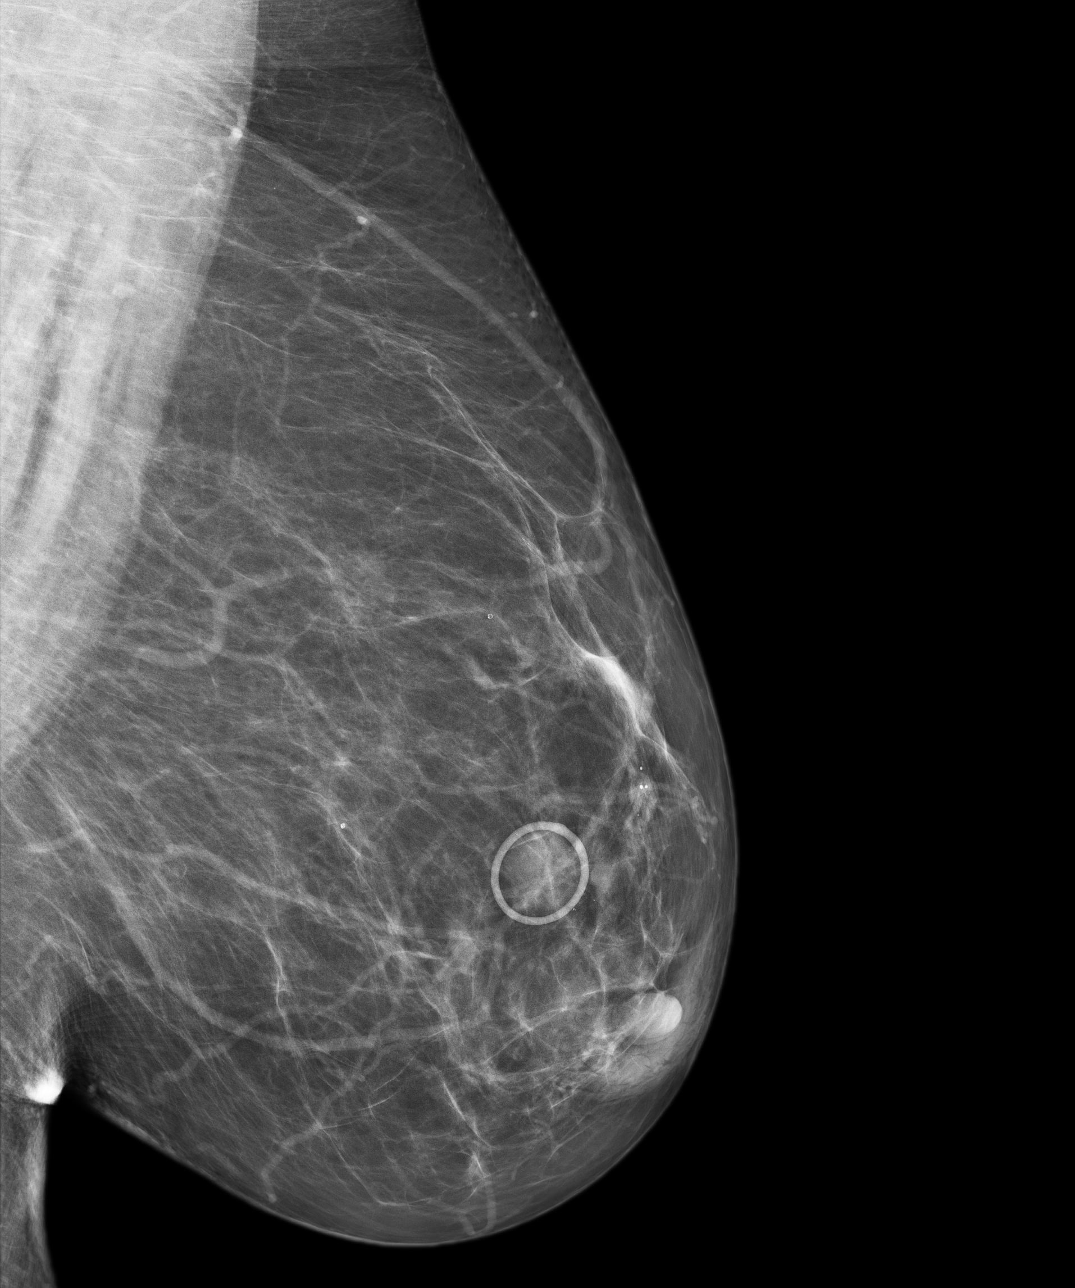

[4 of 4 positions shown; findings below may reference images not displayed]

ACR Breast Density Category b: There are scattered areas of
fibroglandular density.
FINDINGS: There are no findings suspicious for malignancy. Images were
processed with CAD.
IMPRESSION: No mammographic evidence of malignancy. A result letter of this
screening mammogram will be mailed directly to the patient.

RECOMMENDATION:
Screening mammogram in one year. (Code:[[place]])

BI-RADS CATEGORY  1: Negative.

## 2013-09-23 ENCOUNTER — Encounter: Payer: Self-pay | Admitting: Internal Medicine
